# Patient Record
Sex: Female | Born: 1935 | Race: White | Hispanic: No | Smoking: Former smoker
Health system: Southern US, Community
[De-identification: ages and names within clinical notes are randomized; demographics above are authoritative.]

## PROBLEM LIST (undated history)

## (undated) DIAGNOSIS — C801 Malignant (primary) neoplasm, unspecified: Secondary | ICD-10-CM

## (undated) DIAGNOSIS — J40 Bronchitis, not specified as acute or chronic: Secondary | ICD-10-CM

## (undated) HISTORY — DX: Malignant (primary) neoplasm, unspecified: C80.1

---

## 2020-06-16 ENCOUNTER — Emergency Department (HOSPITAL_COMMUNITY)
Admission: EM | Admit: 2020-06-16 | Discharge: 2020-06-16 | Disposition: A | Discharge status: Home / Self Care | Payer: Medicare Other | Attending: Emergency Medicine | Admitting: Emergency Medicine

## 2020-06-16 ENCOUNTER — Emergency Department (HOSPITAL_COMMUNITY): Payer: Medicare Other

## 2020-06-16 ENCOUNTER — Encounter (HOSPITAL_COMMUNITY): Payer: Self-pay | Admitting: *Deleted

## 2020-06-16 ENCOUNTER — Other Ambulatory Visit: Payer: Self-pay

## 2020-06-16 DIAGNOSIS — Y998 Other external cause status: Secondary | ICD-10-CM | POA: Insufficient documentation

## 2020-06-16 DIAGNOSIS — S82841A Displaced bimalleolar fracture of right lower leg, initial encounter for closed fracture: Secondary | ICD-10-CM | POA: Insufficient documentation

## 2020-06-16 DIAGNOSIS — Y9389 Activity, other specified: Secondary | ICD-10-CM | POA: Diagnosis not present

## 2020-06-16 DIAGNOSIS — Y9289 Other specified places as the place of occurrence of the external cause: Secondary | ICD-10-CM | POA: Insufficient documentation

## 2020-06-16 DIAGNOSIS — M25571 Pain in right ankle and joints of right foot: Secondary | ICD-10-CM | POA: Diagnosis present

## 2020-06-16 DIAGNOSIS — W19XXXA Unspecified fall, initial encounter: Secondary | ICD-10-CM | POA: Insufficient documentation

## 2020-06-16 HISTORY — DX: Bronchitis, not specified as acute or chronic: J40

## 2020-06-16 NOTE — ED Triage Notes (Signed)
Pt brought in by [facility] for c/o right ankle pain; pt is unsure of how she injured it; pt has bruising and swelling to right ankle

## 2020-06-16 NOTE — Discharge Instructions (Addendum)
Use your wheelchair.  Take tylenol for pain.

## 2020-06-16 NOTE — ED Provider Notes (Signed)
[facility] Provider Note   CSN: [id number] Arrival date & time: 06/16/20  1723     History Chief Complaint  Patient presents with  . Ankle Pain    Nancy Adkins is a 86 y.o. female.  The history is provided by the patient. No language interpreter was used.  Ankle Pain Location:  Ankle Injury: yes   Mechanism of injury: fall   Ankle location:  R ankle Pain details:    Radiates to:  Does not radiate   Severity:  Moderate   Timing:  Constant   Progression:  Worsening Foreign body present:  No foreign bodies Relieved by:  Nothing Worsened by:  Nothing Ineffective treatments:  None tried      Past Medical History:  Diagnosis Date  . Bronchitis     There are no problems to display for this patient.      OB History   No obstetric history on file.     No family history on file.  Social History   Tobacco Use  . Smoking status: Not on file  Substance Use Topics  . Alcohol use: Not on file  . Drug use: Not on file    Home Medications Prior to Admission medications   Medication Sig Start Date End Date Taking? Authorizing Provider  metoprolol tartrate (LOPRESSOR) 50 MG tablet Take 50 mg by mouth 2 (two) times daily.   Yes [provider]    Allergies    Amoxicillin  Review of Systems   Review of Systems  All other systems reviewed and are negative.   Physical Exam Updated Vital Signs BP 115/69 (BP Location: Right Arm)   Pulse 85   Temp 98.3 F (36.8 C) (Oral)   Resp 18   SpO2 92%   Physical Exam Vitals reviewed.  Cardiovascular:     Rate and Rhythm: Normal rate.  Pulmonary:     Effort: Pulmonary effort is normal.  Musculoskeletal:        General: Swelling and tenderness present.  Skin:    General: Skin is warm.  Neurological:     General: No focal deficit present.     Mental Status: She is alert.  Psychiatric:        Mood and Affect: Mood normal.     ED Results / Procedures / Treatments     Labs (all labs ordered are listed, but only abnormal results are displayed) Labs Reviewed - No data to display  EKG None  Radiology DG Ankle Complete Right  Result Date: 06/16/2020 CLINICAL DATA:  Fall EXAM: RIGHT ANKLE - COMPLETE 3+ VIEW COMPARISON:  None. FINDINGS: Bimalleolar ankle fracture with fractures of the distal fibula and medial malleolus. Fracture extends to the anterior tibial plafond. There is diffuse soft tissue swelling most notably laterally. Widening of the ankle joint. Degenerative spurring at the talonavicular joint. IMPRESSION: Bimalleolar ankle fracture with mild ankle displacement. Electronically Signed   By: Franchot Gallo M.D.   On: 06/16/2020 18:30    Procedures Procedures (including critical care time)  Medications Ordered in ED Medications - No data to display  ED Course  I have reviewed the triage vital signs and the nursing notes.  Pertinent labs & imaging results that were available during my care of the patient were reviewed by me and considered in my medical decision making (see chart for details).    MDM Rules/Calculators/A&P  Xray shows bimalleolar fracture.  Pt placed in a cam walker.  Pt advised to follow up with Dr. Aline Brochure for recheck.  Pt has a wheelchair at home Pt advised to use wheelchair.  No weight bearing  Final Clinical Impression(s) / ED Diagnoses Final diagnoses:  Fx bimalleolar-closed, right, initial encounter    Rx / DC Orders ED Discharge Orders    None    An After Visit Summary was printed and given to the patient.    Sidney Ace 06/16/20 2335    Fredia Sorrow, MD 06/18/20 [phone]

## 2020-06-23 ENCOUNTER — Ambulatory Visit (INDEPENDENT_AMBULATORY_CARE_PROVIDER_SITE_OTHER): Payer: Medicare Other | Admitting: Orthopedic Surgery

## 2020-06-23 ENCOUNTER — Other Ambulatory Visit: Payer: Self-pay

## 2020-06-23 ENCOUNTER — Ambulatory Visit: Payer: Medicare Other

## 2020-06-23 ENCOUNTER — Encounter: Payer: Self-pay | Admitting: Orthopedic Surgery

## 2020-06-23 VITALS — BP 111/68 | HR 81 | Ht 62.0 in

## 2020-06-23 DIAGNOSIS — M25571 Pain in right ankle and joints of right foot: Secondary | ICD-10-CM | POA: Diagnosis not present

## 2020-06-23 DIAGNOSIS — S82841A Displaced bimalleolar fracture of right lower leg, initial encounter for closed fracture: Secondary | ICD-10-CM

## 2020-06-23 NOTE — Patient Instructions (Signed)
No weight bearing on right leg

## 2020-06-23 NOTE — Progress Notes (Signed)
NEW PROBLEM//OFFICE VISIT  Chief Complaint  Patient presents with  . Ankle Injury    right /06/16/2020    86 year old female fell getting out of the shower injured her right ankle complains of medial lateral ankle pain and some swelling  Seen in the ER placed in a cam walker nonweightbearing   Review of Systems  Constitutional: Negative for fever.  HENT: Positive for hearing loss.   Skin: Positive for rash.     Past Medical History:  Diagnosis Date  . Bronchitis     History reviewed. No pertinent surgical history.  Family History  Family history unknown: Yes   Social History   Tobacco Use  . Smoking status: Never Smoker  . Smokeless tobacco: Never Used  Substance Use Topics  . Alcohol use: Not on file  . Drug use: Not on file    Allergies  Allergen Reactions  . Amoxicillin     Current Meds  Medication Sig  . aspirin EC 81 MG tablet Take 81 mg by mouth daily. Swallow whole.  . b complex vitamins capsule Take 1 capsule by mouth daily.  . metoprolol tartrate (LOPRESSOR) 50 MG tablet Take 50 mg by mouth 2 (two) times daily.    BP 111/68   Pulse 81   Ht 5\' 2"  (1.575 m)   Physical Exam Constitutional:      General: She is not in acute distress.    Appearance: She is well-developed.  Cardiovascular:     Comments: No peripheral edema Skin:    General: Skin is warm and dry.  Neurological:     Mental Status: She is alert and oriented to person, place, and time.     Sensory: No sensory deficit.     Coordination: Coordination normal.     Gait: Gait abnormal.     Deep Tendon Reflexes: Reflexes are normal and symmetric.     Ortho Exam  Bruising ecchymosis on the skin of the right foot tenderness over the medial lateral malleoli foot is aligned normally neurovascular exam is intact skin is a little bit shiny  Muscle tone is normal  MEDICAL DECISION MAKING  A.  Encounter Diagnoses  Name Primary?  . Pain in right ankle and joints of right foot Yes  .  Closed bimalleolar fracture of right ankle, initial encounter     B. DATA ANALYSED:    IMAGING: Independent interpretation of images: First films at the hospital [facility] July 28 bimalleolar right ankle fracture  Repeat x-rays today in the office showed no change in the position of the fracture Orders:   Outside records reviewed:   C. MANAGEMENT   Continue nonweightbearing except for transfers she can weight-bear on the heel in the cam walker  X-ray in about a week and a half  No orders of the defined types were placed in this encounter.     Arther Abbott, MD  06/23/2020 12:33 PM

## 2020-07-08 ENCOUNTER — Other Ambulatory Visit: Payer: Self-pay

## 2020-07-08 ENCOUNTER — Ambulatory Visit (INDEPENDENT_AMBULATORY_CARE_PROVIDER_SITE_OTHER): Payer: Medicare Other | Admitting: Orthopedic Surgery

## 2020-07-08 ENCOUNTER — Ambulatory Visit: Payer: Medicare Other

## 2020-07-08 ENCOUNTER — Encounter: Payer: Self-pay | Admitting: Orthopedic Surgery

## 2020-07-08 DIAGNOSIS — S82841A Displaced bimalleolar fracture of right lower leg, initial encounter for closed fracture: Secondary | ICD-10-CM | POA: Insufficient documentation

## 2020-07-08 DIAGNOSIS — S82841D Displaced bimalleolar fracture of right lower leg, subsequent encounter for closed fracture with routine healing: Secondary | ICD-10-CM

## 2020-07-08 NOTE — Progress Notes (Signed)
Patient ID: Nancy Adkins, female   DOB: [birth year], 86 y.o.   MRN: [id number]  FRACTURE CARE   Chief Complaint  Patient presents with  . Ankle Injury    06/16/20 right ankle fracture     Encounter Diagnosis  Name Primary?  . Closed bimalleolar fracture of right ankle with routine healing, subsequent encounter 06/16/20  Yes    FOV: aug 4 global period 11/4   CURRENT TREATMENT : cam walker wb on heel  POST INJURY DAY: 22  Unfortunately she got a skin tear but it was not too bad her daughter who is a CNA took care of it and placed a dressing over it  Her skin is very tenuous but is intact her x-ray shows the fracture is healing in the ankle mortise is intact with minimal displacement of the fibula  Impression healing right bimalleolar fracture patient can weight-bear now at 3 weeks as long as she is in the boot x-ray when she comes back from [place]  Encounter Diagnosis  Name Primary?  . Closed bimalleolar fracture of right ankle with routine healing, subsequent encounter 06/16/20  Yes

## 2020-08-16 ENCOUNTER — Ambulatory Visit (INDEPENDENT_AMBULATORY_CARE_PROVIDER_SITE_OTHER): Payer: Medicare Other | Admitting: Orthopedic Surgery

## 2020-08-16 ENCOUNTER — Other Ambulatory Visit: Payer: Self-pay

## 2020-08-16 ENCOUNTER — Ambulatory Visit: Payer: Medicare Other

## 2020-08-16 DIAGNOSIS — S82841D Displaced bimalleolar fracture of right lower leg, subsequent encounter for closed fracture with routine healing: Secondary | ICD-10-CM

## 2020-08-16 NOTE — Progress Notes (Signed)
Fracture care follow-up routine Encounter Diagnosis  Name Primary?  . Closed bimalleolar fracture of right ankle with routine healing, subsequent encounter Yes    Chief Complaint  Patient presents with  . Follow-up    Recheck on right ankle, DOI 06-16-20.   2 months status post bimalleolar fracture treated with cast/brace  Imaging Ortho care [place]  3 views right ankle for fracture care follow-up  Bimalleolar ankle fracture  The fracture has remained stable and although there is no obvious callus formation we should have fibrous union with an intact ankle mortise  Impression healing bimalleolar ankle fracture with intact ankle mortise

## 2020-09-13 ENCOUNTER — Ambulatory Visit (INDEPENDENT_AMBULATORY_CARE_PROVIDER_SITE_OTHER): Payer: Medicare Other | Admitting: Orthopedic Surgery

## 2020-09-13 ENCOUNTER — Ambulatory Visit: Payer: Medicare Other

## 2020-09-13 ENCOUNTER — Other Ambulatory Visit: Payer: Self-pay

## 2020-09-13 ENCOUNTER — Encounter: Payer: Self-pay | Admitting: Orthopedic Surgery

## 2020-09-13 DIAGNOSIS — S82841D Displaced bimalleolar fracture of right lower leg, subsequent encounter for closed fracture with routine healing: Secondary | ICD-10-CM | POA: Diagnosis not present

## 2020-09-13 NOTE — Patient Instructions (Signed)
Activities as tolerated   Use walker or cane as needed

## 2020-09-13 NOTE — Progress Notes (Signed)
Chief Complaint  Patient presents with  . Ankle Pain    Closed bimalleolora fracture of right ankle 08/16/20    Encounter Diagnosis  Name Primary?  . Closed bimalleolar fracture of right ankle with routine healing, subsequent encounter Yes    Nancy Adkins was seen today for ankle pain.  Diagnoses and all orders for this visit:  Closed bimalleolar fracture of right ankle with routine healing, subsequent encounter -     DG Ankle Complete Right; Future   The patient's ankle has healed according to x-rays today she has full range of motion no pain she can return to normal activities with her walker she is otherwise released

## 2021-03-09 ENCOUNTER — Ambulatory Visit (INDEPENDENT_AMBULATORY_CARE_PROVIDER_SITE_OTHER): Payer: Medicare Other | Admitting: Pulmonary Disease

## 2021-03-09 ENCOUNTER — Ambulatory Visit (INDEPENDENT_AMBULATORY_CARE_PROVIDER_SITE_OTHER): Payer: Medicare Other

## 2021-03-09 ENCOUNTER — Other Ambulatory Visit: Payer: Self-pay

## 2021-03-09 ENCOUNTER — Encounter: Payer: Self-pay | Admitting: Pulmonary Disease

## 2021-03-09 VITALS — BP 108/60 | HR 87 | Temp 98.0°F | Ht 62.0 in | Wt 130.0 lb

## 2021-03-09 DIAGNOSIS — J42 Unspecified chronic bronchitis: Secondary | ICD-10-CM

## 2021-03-09 DIAGNOSIS — J90 Pleural effusion, not elsewhere classified: Secondary | ICD-10-CM

## 2021-03-09 DIAGNOSIS — J849 Interstitial pulmonary disease, unspecified: Secondary | ICD-10-CM | POA: Diagnosis not present

## 2021-03-09 DIAGNOSIS — J9611 Chronic respiratory failure with hypoxia: Secondary | ICD-10-CM | POA: Diagnosis not present

## 2021-03-09 MED ORDER — SPIRIVA RESPIMAT 2.5 MCG/ACT IN AERS
2.0000 | INHALATION_SPRAY | Freq: Every day | RESPIRATORY_TRACT | 6 refills | Status: DC
Start: 2021-03-09 — End: 2021-12-12

## 2021-03-09 MED ORDER — SPIRIVA RESPIMAT 2.5 MCG/ACT IN AERS
2.0000 | INHALATION_SPRAY | Freq: Every day | RESPIRATORY_TRACT | 0 refills | Status: DC
Start: 2021-03-09 — End: 2021-12-12

## 2021-03-09 NOTE — Progress Notes (Signed)
Subjective:   PATIENT ID: Nancy Adkins GENDER: female DOB: 11/30/1923, MRN: [id number]   HPI  Chief Complaint  Patient presents with  . Consult    Sob with exertion, Productive cough with clear mucus, wheezing    Reason for Visit: New consult for shortness of breath  Nancy Adkins is a 87 year old female with history of lung cancer s/p radiation in 2019 and history of skin cancer in 2017, CKD stage 3b who presents as a new patient. Daughter is present  Of note she was hospitalized from 09/24/20 to 09/29/20 for pneumonia and found to have pleural effusion. She had left-sided thoracentesis on 09/28/20 which drained 400 cc serous fluid. Pleural culture negative. She was treated antibiotic and diuresis and discharged on 2L O2. Discharge note comments that she still had persistent left lower lobe infiltrate after thoracentesis.  Clinic note from 3/31 by Dr. Ileana Roup reviewed. She is followed by Oncology in [place] and was told she had fluid in her left lung. She had shortness of breath with exertion and is on 2L O2 via [place]. Last CXR was on 3/3.    She reports she had a little bit of lung fluid since September. Her oncologist reports that no progression of cancer 2019. With exertion she is having shortness of breath with productive cough and wheezing. Albuterol neb twice daily. She is not on any other inhalers.  Social History: Quit smoking in 2003. Previously smoked 1/2 ppd x 50 years. 25 pack years  I have personally reviewed patient's past medical/family/social history, allergies, current medications.  Past Medical History:  Diagnosis Date  . Bronchitis      Family History  Family history unknown: Yes     Social History   Occupational History  . Not on file  Tobacco Use  . Smoking status: Never Smoker  . Smokeless tobacco: Never Used  Substance and Sexual Activity  . Alcohol use: Not on file  . Drug use: Not on file  . Sexual activity: Not on file     Allergies  Allergen Reactions  . Amoxicillin      Outpatient Medications Prior to Visit  Medication Sig Dispense Refill  . albuterol (PROVENTIL) (2.5 MG/3ML) 0.083% nebulizer solution     . aspirin EC 81 MG tablet Take 81 mg by mouth daily. Swallow whole.    . b complex vitamins capsule Take 1 capsule by mouth daily.    Marland Kitchen guaifenesin (ROBITUSSIN) 100 MG/5ML syrup Take by mouth.    . metoprolol tartrate (LOPRESSOR) 50 MG tablet Take 50 mg by mouth 1 day or 1 dose.     . Multiple Vitamin (MULTIVITAMIN WITH MINERALS) TABS tablet Take 1 tablet by mouth daily.    . clindamycin (CLEOCIN) 300 MG capsule Take 300 mg by mouth 2 (two) times daily.     No facility-administered medications prior to visit.    Review of Systems  Constitutional: Positive for malaise/fatigue. Negative for chills, diaphoresis, fever and weight loss.  HENT: Negative for congestion, ear pain and sore throat.   Respiratory: Positive for cough, shortness of breath and wheezing. Negative for hemoptysis and sputum production.   Cardiovascular: Negative for chest pain, palpitations and leg swelling.  Gastrointestinal: Negative for abdominal pain, heartburn and nausea.  Genitourinary: Negative for frequency.  Musculoskeletal: Negative for joint pain and myalgias.  Skin: Negative for itching and rash.  Neurological: Negative for dizziness, weakness and headaches.  Endo/Heme/Allergies: Does not bruise/bleed easily.  Psychiatric/Behavioral: Negative for depression.  The patient is not nervous/anxious.      Objective:   Vitals:   03/09/21 1339  BP: 108/60  Pulse: 87  SpO2: 94%   SpO2: 94 % O2 Device: None (Room air)  Physical Exam: General: Well-appearing, no acute distress HENT: [place], AT, difficulty hearing Eyes: EOMI, no scleral icterus Respiratory: Diminished basilar breath sounds. Clear to auscultation bilaterally.  No crackles, wheezing or rales Cardiovascular: RRR, -M/R/G, no  JVD Extremities:-Edema,-tenderness Neuro: AAO x4, CNII-XII grossly intact Skin: Intact, no rashes or bruising Psych: Normal mood, normal affect  Data Reviewed:  Imaging: CT Chest 09/25/20 report from [facility]: 1. Small left hydropneumothorax with tiny less than 5% anterior  pneumothorax component.  2. Complete left lower lobe atelectasis with proximal occlusion of  central left lower lobe airways with low-attenuation material,  favoring mucous plugging.  3. Irregular nodular foci of consolidation in the peripheral right  lung, largest 2.2 cm in the peripheral right lower lobe,  indeterminate, potentially infectious/inflammatory, with neoplasm  not excluded. Recommend attention on follow-up chest CT in 2-3  months.  4. Spectrum of findings throughout both lungs suggestive of  underlying fibrotic interstitial lung disease without frank  honeycombing. UIP not excluded. Outpatient follow-up high-resolution  chest CT study may be obtained for further evaluation in 3-6 months  as clinically warranted.  5. Bandlike consolidation, volume loss and bronchiectasis in the  lingula may represent post treatment change or postinfectious  scarring, correlate with clinical history.  6. Nonspecific mild AP window adenopathy,probably reactive.  7. One vessel coronary atherosclerosis.  8. Partially visualized hyperdense 1.2 cm exophytic renal cortical  lesion in the posterior upper right kidney, indeterminate,  potentially a hemorrhagic/proteinaceous renal cyst, with renal  neoplasm not excluded. Outpatient evaluation with renal mass  protocol MRI (preferred) or CT abdomen without and with IV contrast  may be obtained as clinically warranted.  9. Aortic Atherosclerosis (ICD10-I70.0).   CXR 03/09/21 -Left lower lobe consolidation with small pleural effusion  PFT: None  Labs:  WBC 6.5 Absolute Eos 01/20/21 - 300  Pleural labs 09/28/20 LDH 266 Protein 4.8 (Serum protein 6.8)    Imaging, labs and  test noted above have been reviewed independently by me.  Assessment & Plan:   Discussion:  Left pleural effusion, hydropneumothorax Likely chronic. Present since November 2021. No daytime O2 needs but will wear it at night Recurrence after drainage in November. I suspect even with drainage, this will likely reaccumulate - CT imaging as noted below - No thoracentesis recommended today  Chronic bronchitis +/- fibrosis - HRCT Chest without contrast - START Spiriva 2.5 mcg TWO puffs ONCE a day - CONTINUE Albuterol nebulizer as needed for shortness of breath  Chronic hypoxemic respiratory failure - Overnight oximetry on room air. Please call our office the day testing is completed so we can obtain the results immediately  Health Maintenance Immunization History  Administered Date(s) Administered  . Influenza Inj Mdck Quad Pf 10/12/2020  . Pneumococcal Conjugate-13 10/12/2020   CT Lung Screen - not indicated  Orders Placed This Encounter  Procedures  . DG Chest 2 View    Standing Status:   Future    Number of Occurrences:   1    Standing Expiration Date:   03/09/2022    Order Specific Question:   Reason for Exam (SYMPTOM  OR DIAGNOSIS REQUIRED)    Answer:   Pleural Effusion    Order Specific Question:   Preferred imaging location?    Answer:   Internal  .  CT CHEST HIGH RESOLUTION    Standing Status:   Future    Standing Expiration Date:   03/09/2022    Scheduling Instructions:     First available    Order Specific Question:   Preferred imaging location?    Answer:   [place] [facility] - [employer]  . Pulse oximetry, overnight    On room air    Standing Status:   Future    Standing Expiration Date:   03/09/2022   Meds ordered this encounter  Medications  . Tiotropium Bromide Monohydrate (SPIRIVA RESPIMAT) 2.5 MCG/ACT AERS    Sig: Inhale 2 puffs into the lungs daily.    Dispense:  4 g    Refill:  0    Order Specific Question:   Lot Number?    Answer:   [id number]  . Tiotropium  Bromide Monohydrate (SPIRIVA RESPIMAT) 2.5 MCG/ACT AERS    Sig: Inhale 2 puffs into the lungs daily.    Dispense:  4 g    Refill:  6    Return in about 2 months (around 05/09/2021).  I have spent a total time of 45-minutes on the day of the appointment reviewing prior documentation, coordinating care and discussing medical diagnosis and plan with the patient/family. Imaging, labs and tests included in this note have been reviewed and interpreted independently by me.  [place], MD [facility] 03/09/2021 1:41 PM  Office Number [phone]

## 2021-03-09 NOTE — Patient Instructions (Addendum)
Left pleural effusion, hydropneumothorax Likely chronic. Present since November 2021. No daytime O2 needs but will wear it at night Recurrence after drainage in November. I suspect even with drainage, this will likely reaccumulate - CT imaging as noted below - No thoracentesis recommended today  Chronic bronchitis +/- fibrosis - HRCT Chest without contrast - START Spiriva 2.5 mcg TWO puffs ONCE a day - CONTINUE Albuterol nebulizer as needed for shortness of breath  Chronic hypoxemic respiratory failure - Overnight oximetry on room air. Please call our office the day testing is completed so we can obtain the results immediately

## 2021-03-15 DIAGNOSIS — J849 Interstitial pulmonary disease, unspecified: Secondary | ICD-10-CM | POA: Insufficient documentation

## 2021-03-15 DIAGNOSIS — J90 Pleural effusion, not elsewhere classified: Secondary | ICD-10-CM | POA: Insufficient documentation

## 2021-03-15 DIAGNOSIS — J9611 Chronic respiratory failure with hypoxia: Secondary | ICD-10-CM | POA: Insufficient documentation

## 2021-03-15 DIAGNOSIS — J42 Unspecified chronic bronchitis: Secondary | ICD-10-CM | POA: Insufficient documentation

## 2021-03-29 ENCOUNTER — Ambulatory Visit (HOSPITAL_COMMUNITY)
Admission: RE | Admit: 2021-03-29 | Discharge: 2021-03-29 | Disposition: A | Discharge status: Home / Self Care | Payer: Medicare Other | Source: Ambulatory Visit | Attending: Pulmonary Disease | Admitting: Pulmonary Disease

## 2021-03-29 ENCOUNTER — Other Ambulatory Visit: Payer: Self-pay

## 2021-03-29 DIAGNOSIS — J849 Interstitial pulmonary disease, unspecified: Secondary | ICD-10-CM | POA: Diagnosis present

## 2021-03-29 DIAGNOSIS — J42 Unspecified chronic bronchitis: Secondary | ICD-10-CM | POA: Diagnosis present

## 2021-04-14 ENCOUNTER — Telehealth: Payer: Self-pay | Admitting: Pulmonary Disease

## 2021-04-14 NOTE — Telephone Encounter (Signed)
[facility] Telephone Encounter  Contacted patient and discussed CT Chest results with her DPR Carolin Coy -daughter). Left pleural effusion is likely trapped lung in setting of interstitial lung disease with high isk of reaccumulation despite drainage. Patient currently on RA during day with sats >90%. She continues to wear oxygen overnight and has not had overnight oximetry arranged. Patient travels back and forth between [place] with her daughters and is currently in [place].  Assessment Interstitial lung disease with trapped lung  --No indication for thoracentesis at this time --If she develops worsening respiratory symptoms or hypoxemia, she will need to be evaluated  --Briefly discussed and encouraged further conversations regarding goals of care including if patient wishes to be on ventilator or receive CPR with her known chronic medical condidtions  Follow-up with me in 3 months  Rodman Pickle, M.D. [facility] 04/14/2021 10:59 AM

## 2021-04-14 NOTE — Progress Notes (Signed)
Dr Cordelia Pen schedule is not yet open for July 2022. Will keep until it's open so we can schedule him.

## 2021-08-03 ENCOUNTER — Ambulatory Visit: Payer: Medicare Other | Admitting: Pulmonary Disease

## 2021-12-12 ENCOUNTER — Encounter: Payer: Self-pay | Admitting: Pulmonary Disease

## 2021-12-12 ENCOUNTER — Ambulatory Visit (INDEPENDENT_AMBULATORY_CARE_PROVIDER_SITE_OTHER): Payer: Medicare Other

## 2021-12-12 ENCOUNTER — Ambulatory Visit (INDEPENDENT_AMBULATORY_CARE_PROVIDER_SITE_OTHER): Payer: Medicare Other | Admitting: Pulmonary Disease

## 2021-12-12 ENCOUNTER — Other Ambulatory Visit: Payer: Self-pay

## 2021-12-12 VITALS — BP 98/68 | HR 81 | Temp 98.1°F | Ht 62.0 in | Wt 127.6 lb

## 2021-12-12 DIAGNOSIS — R0602 Shortness of breath: Secondary | ICD-10-CM | POA: Diagnosis not present

## 2021-12-12 DIAGNOSIS — Z7189 Other specified counseling: Secondary | ICD-10-CM

## 2021-12-12 DIAGNOSIS — J90 Pleural effusion, not elsewhere classified: Secondary | ICD-10-CM | POA: Diagnosis not present

## 2021-12-12 DIAGNOSIS — J42 Unspecified chronic bronchitis: Secondary | ICD-10-CM | POA: Diagnosis not present

## 2021-12-12 MED ORDER — YUPELRI 175 MCG/3ML IN SOLN: 175.0000 ug | Freq: Every day | RESPIRATORY_TRACT | 5 refills | Status: AC

## 2021-12-12 MED ORDER — YUPELRI 175 MCG/3ML IN SOLN: 175.0000 ug | Freq: Every day | RESPIRATORY_TRACT | 0 refills | Status: AC

## 2021-12-12 NOTE — Patient Instructions (Addendum)
Shortness of breath Left pleural effusion, hydropneumothorax, trapped lung Likely chronic. Present since November 2021. Recurrence after drainage in November. I suspect even with drainage, this will likely reaccumulate - CXR today with moderate pleural effusion - We discussed risks and benefits of thoracentesis for symptomatic relief. Risk of recurrence is high. Patient declines for now.  Chronic bronchitis +/- fibrosis - STOP Spiriva - START Yupelri ONE inhalation daily - CONTINUE Albuterol nebulizer as needed for shortness of breath  Chronic hypoxemic respiratory failure - Ambulatory O2 in-office demonstrates desaturation - Wear supplemental oxygent for goal >88%  Follow-up with me in 3 months

## 2021-12-12 NOTE — Progress Notes (Signed)
Subjective:   PATIENT ID: Nancy Adkins GENDER: female DOB: 08/28/1924, MRN: [id number]   HPI  Chief Complaint  Patient presents with   Follow-up    Shortness of breath, pleural effusion    Reason for Visit: Follow-up  Ms. Nancy Adkins is a 88 year old female with history of lung cancer s/p radiation in 2019, trapped lung and history of skin cancer in 2017, CKD stage 3b who presents for follow-up.  Initial Consult Of note she was hospitalized from 09/24/20 to 09/29/20 for pneumonia and found to have pleural effusion. She had left-sided thoracentesis on 09/28/20 which drained 400 cc serous fluid. Pleural culture negative. She was treated antibiotic and diuresis and discharged on 2L O2. Discharge note comments that she still had persistent left lower lobe infiltrate after thoracentesis. Clinic note from 3/31 by Dr. Ileana Roup reviewed. She is followed by Oncology in [place] and was told she had fluid in her left lung. She had shortness of breath with exertion and is on 2L O2 via [place]. Last CXR was on 3/3. She reports she had a little bit of lung fluid since September. Her oncologist reports that no progression of cancer 2019. With exertion she is having shortness of breath with productive cough and wheezing. Albuterol neb twice daily. She is not on any other inhalers.  12/12/21 Daughter is present and provides significant history due to poor cognition. Since our last visit having difficulty coordinator inhaler use with Spiriva. Patient has more success in using nebulizer with face mask. Patient denies any worsening shortness of breath at baseline however daughter reports patient is markedly worse with activity in shortness of breath. Unchanged productive cough. Some wheezing.  Social History: Quit smoking in 2003. Previously smoked 1/2 ppd x 50 years. 25 pack years  Past Medical History:  Diagnosis Date   Bronchitis    Cancer ([place])      Family History  Problem Relation Age of  Onset   Cancer Mother      Social History   Occupational History   Not on file  Tobacco Use   Smoking status: Former    Packs/day: 0.50    Years: 50.00    Pack years: 25.00    Types: Cigarettes    Quit date: 2003    Years since quitting: 20.0   Smokeless tobacco: Never  Substance and Sexual Activity   Alcohol use: Not on file   Drug use: Not on file   Sexual activity: Not on file    Allergies  Allergen Reactions   Amoxicillin      Outpatient Medications Prior to Visit  Medication Sig Dispense Refill   albuterol (PROVENTIL) (2.5 MG/3ML) 0.083% nebulizer solution      aspirin EC 81 MG tablet Take 81 mg by mouth daily. Swallow whole.     b complex vitamins capsule Take 1 capsule by mouth daily.     clindamycin (CLEOCIN) 300 MG capsule Take 300 mg by mouth 2 (two) times daily.     guaifenesin (ROBITUSSIN) 100 MG/5ML syrup Take by mouth.     metoprolol tartrate (LOPRESSOR) 50 MG tablet Take 50 mg by mouth 1 day or 1 dose.      Multiple Vitamin (MULTIVITAMIN WITH MINERALS) TABS tablet Take 1 tablet by mouth daily.     Tiotropium Bromide Monohydrate (SPIRIVA RESPIMAT) 2.5 MCG/ACT AERS Inhale 2 puffs into the lungs daily. 4 g 0   Tiotropium Bromide Monohydrate (SPIRIVA RESPIMAT) 2.5 MCG/ACT AERS Inhale 2 puffs into the  lungs daily. 4 g 6   No facility-administered medications prior to visit.    Review of Systems  Constitutional:  Negative for chills, diaphoresis, fever, malaise/fatigue and weight loss.  HENT:  Negative for congestion.   Respiratory:  Positive for cough, shortness of breath and wheezing. Negative for hemoptysis and sputum production.   Cardiovascular:  Negative for chest pain, palpitations and leg swelling.    Objective:   Vitals:   12/12/21 1407  BP: 98/68  Pulse: 81  Temp: 98.1 F (36.7 C)  TempSrc: Oral  SpO2: 90%  Weight: 127 lb 9.6 oz (57.9 kg)  Height: 5\' 2"  (1.575 m)   SpO2: 90 % O2 Device: None (Room air)  Physical Exam: General:  Well-appearing, no acute distress HENT: , AT, difficulty hearing Eyes: EOMI, no scleral icterus Respiratory: Diminished basilar breath sounds. Clear to auscultation bilaterally.  No crackles, wheezing or rales Cardiovascular: RRR, -M/R/G, no JVD Extremities:-Edema,-tenderness Neuro: AAO x4, CNII-XII grossly intact Skin: Intact, no rashes or bruising Psych: Normal mood, normal affect  Data Reviewed:  Imaging: CT Chest 09/25/20 report from [facility]: 1. Small left hydropneumothorax with tiny less than 5% anterior  pneumothorax component.  2. Complete left lower lobe atelectasis with proximal occlusion of  central left lower lobe airways with low-attenuation material,  favoring mucous plugging.  3. Irregular nodular foci of consolidation in the peripheral right  lung, largest 2.2 cm in the peripheral right lower lobe,  indeterminate, potentially infectious/inflammatory, with neoplasm  not excluded. Recommend attention on follow-up chest CT in 2-3  months.  4. Spectrum of findings throughout both lungs suggestive of  underlying fibrotic interstitial lung disease without frank  honeycombing. UIP not excluded. Outpatient follow-up high-resolution  chest CT study may be obtained for further evaluation in 3-6 months  as clinically warranted.  5. Bandlike consolidation, volume loss and bronchiectasis in the  lingula may represent post treatment change or postinfectious  scarring, correlate with clinical history.  6. Nonspecific mild AP window adenopathy, probably reactive.  7. One vessel coronary atherosclerosis.  8. Partially visualized hyperdense 1.2 cm exophytic renal cortical  lesion in the posterior upper right kidney, indeterminate,  potentially a hemorrhagic/proteinaceous renal cyst, with renal  neoplasm not excluded. Outpatient evaluation with renal mass  protocol MRI (preferred) or CT abdomen without and with IV contrast  may be obtained as clinically warranted.  9. Aortic  Atherosclerosis (ICD10-I70.0).   CXR 03/09/21 -Left lower lobe consolidation with small pleural effusion CT HR 03/29/21 - Limited by motion artifact. Occlusion of LLL segemental with complete atelectasis, mild/mo pulmonary fibrosis, bronchiectasis, and honeycombing at the bases. Moderate pleural effusion. CXR 12/12/21 - Unchanged moderate left pleural effusion  PFT: None  Labs: CBC No results found for: WBC, RBC, HGB, HCT, PLT, MCV, MCH, MCHC, RDW, LYMPHSABS, MONOABS, EOSABS, BASOSABS  WBC 6.5 Absolute Eos 01/20/21 - 300  Pleural labs 09/28/20 LDH 266 Protein 4.8 (Serum protein 6.8)   Assessment & Plan:   Discussion:  Left pleural effusion is likely trapped lung in setting of interstitial lung disease with high isk of reaccumulation despite drainage. Patient currently on RA during day with sats >90%. She continues to wear oxygen overnight and has not had overnight oximetry arranged. Patient travels back and forth between [place] with her daughters and is currently in [place].  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  Reported worsening dyspnea per daughter. Ambulatory O2 demonstrates unchanged O2 requirement of 2L with exertion. CXR with unchanged moderate pleural effusion. We discussed therapeutic thoracentesis including the risk of fluid reaccumulation. After discussion, decision made to hold on procedure until her symptoms significantly worsen. We again discussed goals of care. Daughter reports living will is available and will look for it.  Shortness of breath secondary to below Left hydropneumothorax, trapped lung with atelectasis Likely chronic. Present since November 2021. Recurrence after drainage in November. I suspect even with drainage, this will likely reaccumulate - CXR today with moderate pleural effusion - We discussed risks  and benefits of thoracentesis for symptomatic relief. Risk of recurrence is high. Patient declines for now.  Chronic bronchitis +/- fibrosis - STOP Spiriva - START Yupelri ONE inhalation daily - CONTINUE Albuterol nebulizer as needed for shortness of breath - Continue to use face mask  Chronic hypoxemic respiratory failure - Ambulatory O2 in-office demonstrates desaturation - Wear supplemental oxygen of 2L via [place] for goal >88%  GOC - Living will available. Believes mother would want to limit life support measures - Daughter is HCPOA - We discussed aggressive care with life support vs DNR vs hospice. No decision at this point.  Health Maintenance Immunization History  Administered Date(s) Administered   Influenza Inj Mdck Quad Pf 10/12/2020   Pneumococcal Conjugate-13 10/12/2020   CT Lung Screen - not indicated  Orders Placed This Encounter  Procedures   DG Chest 2 View    Standing Status:   Future    Number of Occurrences:   1    Standing Expiration Date:   12/12/2022    Order Specific Question:   Reason for Exam (SYMPTOM  OR DIAGNOSIS REQUIRED)    Answer:   left pleural effusion    Order Specific Question:   Preferred imaging location?    Answer:   Internal   Meds ordered this encounter  Medications   revefenacin (YUPELRI) 175 MCG/3ML nebulizer solution    Sig: Take 3 mLs (175 mcg total) by nebulization daily.    Dispense:  90 mL    Refill:  5   revefenacin (YUPELRI) 175 MCG/3ML nebulizer solution    Sig: Take 3 mLs (175 mcg total) by nebulization daily.    Dispense:  90 mL    Refill:  0    Return in about 3 months (around 03/12/2022).  I have spent a total time of 40-minutes on the day of the appointment reviewing prior documentation, coordinating care and discussing medical diagnosis and plan with the patient/family. Past medical history, allergies, medications were reviewed. Pertinent imaging, labs and tests included in this note have been reviewed and interpreted  independently by me.  [place], MD [facility] 12/12/2021 3:34 PM  Office Number [phone]

## 2022-02-06 ENCOUNTER — Telehealth: Payer: Self-pay | Admitting: Pulmonary Disease

## 2022-02-06 NOTE — Telephone Encounter (Signed)
Called and spoke with pharmacy tech. Provided her with the diagnosis code. Nothing further needed. ?

## 2022-03-13 ENCOUNTER — Ambulatory Visit: Payer: Medicare Other | Admitting: Pulmonary Disease

## 2022-03-15 ENCOUNTER — Other Ambulatory Visit: Payer: Self-pay | Admitting: Pulmonary Disease

## 2022-03-20 DEATH — deceased

## 2022-09-14 IMAGING — DX DG CHEST 2V
2 series · 2 of 2 positions shown · non-contrast
Comparison: Chest x-ray 03/09/2021.

CLINICAL DATA: [AGE] female with history of left-sided
pleural effusion.

EXAM:
CHEST - 2 VIEW

[chest pa]
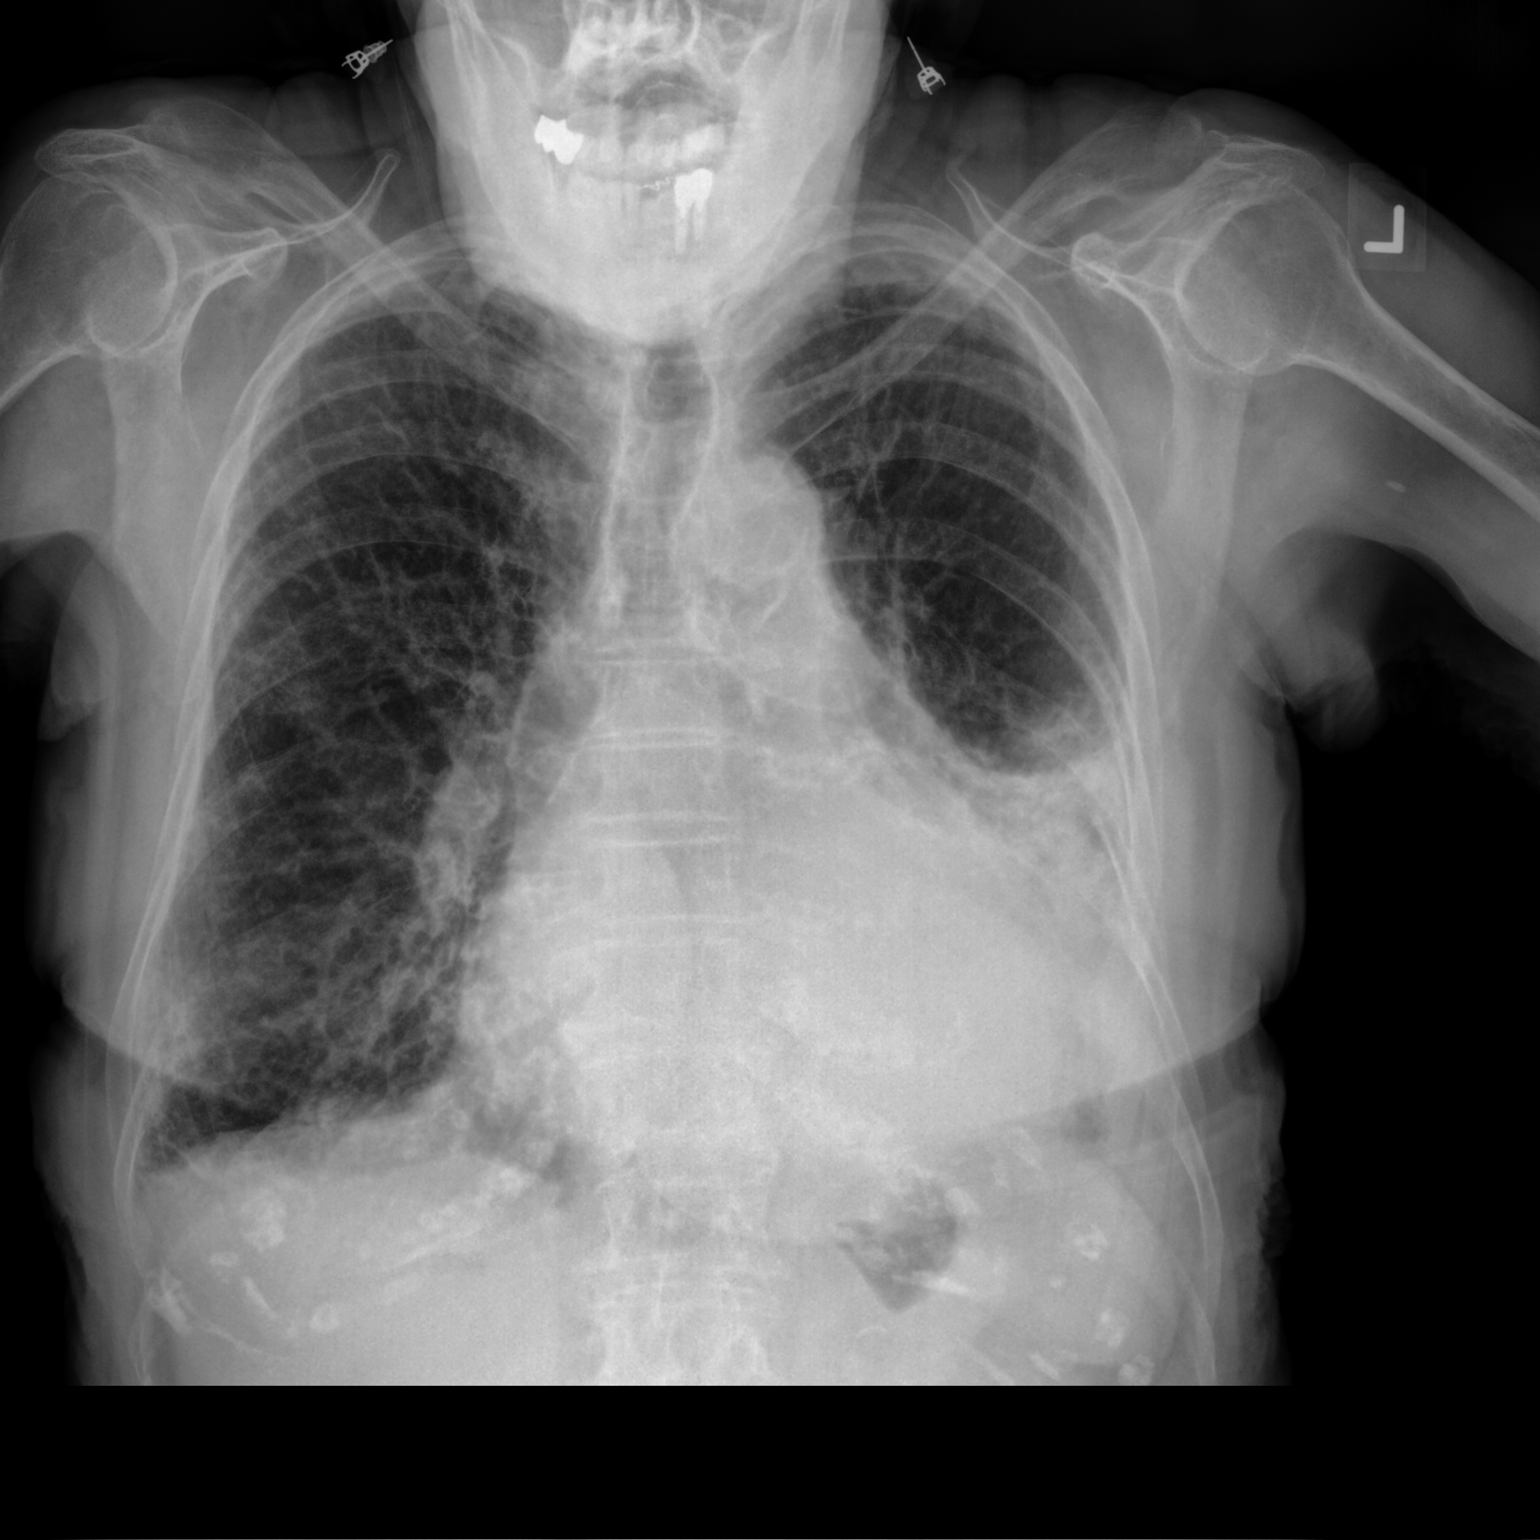

[chest lat]
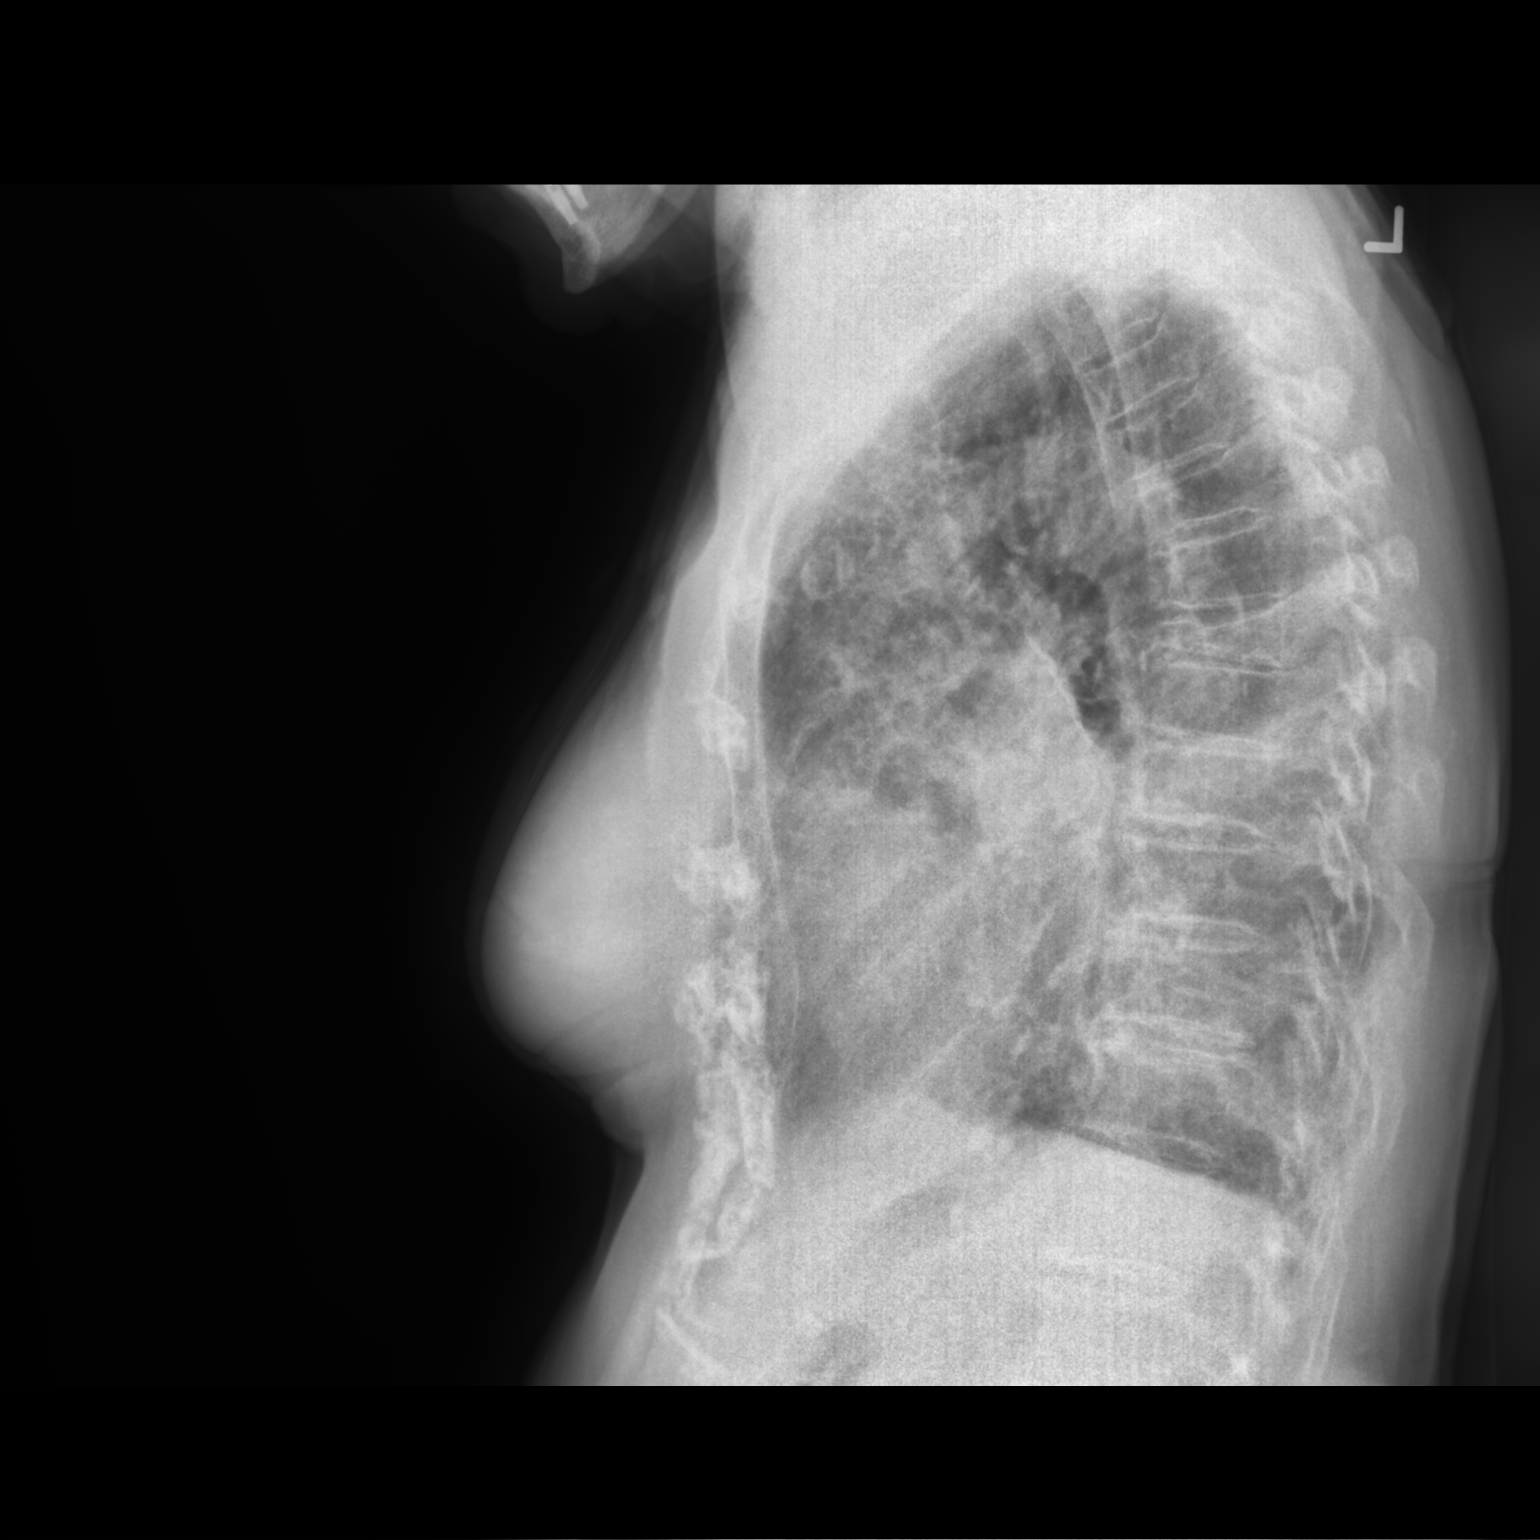

[2 of 2 positions shown; findings below may reference images not displayed]

FINDINGS: Persistent opacity throughout the left mid to lower hemithorax
compatible with residual areas of atelectasis and/or consolidation,
and moderate left pleural effusion. Widespread areas of interstitial
prominence are noted in the lungs bilaterally, indicative of chronic
interstitial lung disease, better demonstrated on prior chest CT. No
right pleural effusion. No pneumothorax. No evidence of pulmonary
edema. Heart size is mildly enlarged. Upper mediastinal contours are
within normal limits.
IMPRESSION: 1. The radiographic appearance the chest is very similar to prior
study from 03/09/2021 with probable areas of chronic
scarring/atelectasis in the left lower lobe and chronic left
moderate pleural effusion.
2. Chronic findings of interstitial lung disease, mildly progressive
compared to the prior study. Repeat high-resolution chest CT is
suggested to better characterize these findings.
3. Mild cardiomegaly.
4. Aortic atherosclerosis.
